# Patient Record
Sex: Female | Born: 1975
Health system: Southern US, Community
[De-identification: ages and names within clinical notes are randomized; demographics above are authoritative.]

---

## 2009-11-29 ENCOUNTER — Emergency Department: Payer: Self-pay | Admitting: Emergency Medicine

## 2009-12-03 ENCOUNTER — Emergency Department: Payer: Self-pay | Admitting: Emergency Medicine

## 2010-03-06 ENCOUNTER — Emergency Department: Payer: Self-pay | Admitting: Emergency Medicine

## 2012-05-27 ENCOUNTER — Emergency Department: Payer: Self-pay | Admitting: Emergency Medicine

## 2012-05-27 LAB — CBC
HCT: 40.9 % (ref 35.0–47.0)
MCHC: 34 g/dL (ref 32.0–36.0)
RBC: 4.84 10*6/uL (ref 3.80–5.20)
RDW: 14.2 % (ref 11.5–14.5)
WBC: 8.5 10*3/uL (ref 3.6–11.0)

## 2012-05-27 LAB — COMPREHENSIVE METABOLIC PANEL
Albumin: 3.8 g/dL (ref 3.4–5.0)
Alkaline Phosphatase: 87 U/L (ref 50–136)
Anion Gap: 8 (ref 7–16)
BUN: 14 mg/dL (ref 7–18)
Bilirubin,Total: 1.7 mg/dL — ABNORMAL HIGH (ref 0.2–1.0)
Chloride: 106 mmol/L (ref 98–107)
Co2: 25 mmol/L (ref 21–32)
Creatinine: 0.84 mg/dL (ref 0.60–1.30)
EGFR (African American): >60
Glucose: 109 mg/dL — ABNORMAL HIGH (ref 65–99)
Osmolality: 279 (ref 275–301)
Potassium: 3.4 mmol/L — ABNORMAL LOW (ref 3.5–5.1)
SGOT(AST): 24 U/L (ref 15–37)

## 2012-05-27 LAB — URINALYSIS, COMPLETE
Bacteria: NONE SEEN
Bilirubin,UR: NEGATIVE
Glucose,UR: NEGATIVE mg/dL (ref 0–75)
Ketone: NEGATIVE
Nitrite: NEGATIVE
Protein: NEGATIVE
RBC,UR: <1 /HPF (ref 0–5)
Specific Gravity: 1.03 (ref 1.003–1.030)
Squamous Epithelial: 1
WBC UR: 2 /HPF (ref 0–5)

## 2015-05-02 ENCOUNTER — Emergency Department: Payer: Managed Care, Other (non HMO)

## 2015-05-02 ENCOUNTER — Encounter: Payer: Self-pay | Admitting: Emergency Medicine

## 2015-05-02 ENCOUNTER — Emergency Department
Admission: EM | Admit: 2015-05-02 | Discharge: 2015-05-02 | Disposition: A | Discharge status: Home / Self Care | Payer: Managed Care, Other (non HMO) | Attending: Emergency Medicine | Admitting: Emergency Medicine

## 2015-05-02 DIAGNOSIS — Y9389 Activity, other specified: Secondary | ICD-10-CM | POA: Diagnosis not present

## 2015-05-02 DIAGNOSIS — S59912A Unspecified injury of left forearm, initial encounter: Secondary | ICD-10-CM | POA: Diagnosis not present

## 2015-05-02 DIAGNOSIS — Y9289 Other specified places as the place of occurrence of the external cause: Secondary | ICD-10-CM | POA: Insufficient documentation

## 2015-05-02 DIAGNOSIS — Y998 Other external cause status: Secondary | ICD-10-CM | POA: Diagnosis not present

## 2015-05-02 DIAGNOSIS — S63502A Unspecified sprain of left wrist, initial encounter: Secondary | ICD-10-CM | POA: Insufficient documentation

## 2015-05-02 DIAGNOSIS — W108XXA Fall (on) (from) other stairs and steps, initial encounter: Secondary | ICD-10-CM | POA: Insufficient documentation

## 2015-05-02 DIAGNOSIS — S0083XA Contusion of other part of head, initial encounter: Secondary | ICD-10-CM | POA: Diagnosis not present

## 2015-05-02 DIAGNOSIS — Z87891 Personal history of nicotine dependence: Secondary | ICD-10-CM | POA: Diagnosis not present

## 2015-05-02 DIAGNOSIS — S6992XA Unspecified injury of left wrist, hand and finger(s), initial encounter: Secondary | ICD-10-CM | POA: Diagnosis present

## 2015-05-02 DIAGNOSIS — S0990XA Unspecified injury of head, initial encounter: Secondary | ICD-10-CM | POA: Insufficient documentation

## 2015-05-02 DIAGNOSIS — W01198A Fall on same level from slipping, tripping and stumbling with subsequent striking against other object, initial encounter: Secondary | ICD-10-CM | POA: Insufficient documentation

## 2015-05-02 MED ORDER — OXYCODONE-ACETAMINOPHEN 5-325 MG PO TABS
1.0000 | ORAL_TABLET | Freq: Once | ORAL | Status: AC
  Administered 2015-05-02: 1 via ORAL
  Filled 2015-05-02: qty 1

## 2015-05-02 MED ORDER — NAPROXEN 500 MG PO TABS
500.0000 mg | ORAL_TABLET | Freq: Two times a day (BID) | ORAL | Status: DC
Start: 2015-05-02 — End: 2015-07-14

## 2015-05-02 MED ORDER — OXYCODONE-ACETAMINOPHEN 5-325 MG PO TABS: 1.0000 | ORAL_TABLET | Freq: Four times a day (QID) | ORAL | Status: DC | PRN

## 2015-05-02 NOTE — Discharge Instructions (Signed)
Facial or Scalp Contusion A facial or scalp contusion is a deep bruise on the face or head. Injuries to the face and head generally cause a lot of swelling, especially around the eyes. Contusions are the result of an injury that caused bleeding under the skin. The contusion may turn blue, purple, or yellow. Minor injuries will give you a painless contusion, but more severe contusions may stay painful and swollen for a few weeks.  CAUSES  A facial or scalp contusion is caused by a blunt injury or trauma to the face or head area.  SIGNS AND SYMPTOMS   Swelling of the injured area.   Discoloration of the injured area.   Tenderness, soreness, or pain in the injured area.  DIAGNOSIS  The diagnosis can be made by taking a medical history and doing a physical exam. An X-ray exam, CT scan, or MRI may be needed to determine if there are any associated injuries, such as broken bones (fractures). TREATMENT  Often, the best treatment for a facial or scalp contusion is applying cold compresses to the injured area. Over-the-counter medicines may also be recommended for pain control.  HOME CARE INSTRUCTIONS   Only take over-the-counter or prescription medicines as directed by your health care provider.   Apply ice to the injured area.   Put ice in a plastic bag.   Place a towel between your skin and the bag.   Leave the ice on for 20 minutes, 2-3 times a day.  SEEK MEDICAL CARE IF:  You have bite problems.   You have pain with chewing.   You are concerned about facial defects. SEEK IMMEDIATE MEDICAL CARE IF:  You have severe pain or a headache that is not relieved by medicine.   You have unusual sleepiness, confusion, or personality changes.   You throw up (vomit).   You have a persistent nosebleed.   You have double vision or blurred vision.   You have fluid drainage from your nose or ear.   You have difficulty walking or using your arms or legs.  MAKE SURE YOU:    Understand these instructions.  Will watch your condition.  Will get help right away if you are not doing well or get worse.   This information is not intended to replace advice given to you by your health care provider. Make sure you discuss any questions you have with your health care provider.   Document Released: 04/28/2004 Document Revised: 04/11/2014 Document Reviewed: 11/01/2012 Elsevier Interactive Patient Education 2016 [employer]  Wrist Sprain A wrist sprain is a stretch or tear in the strong, fibrous tissues (ligaments) that connect your wrist bones. The ligaments of your wrist may be easily sprained. There are three types of wrist sprains.  Grade 1. The ligament is not stretched or torn, but the sprain causes pain.  Grade 2. The ligament is stretched or partially torn. You may be able to move your wrist, but not very much.  Grade 3. The ligament or muscle completely tears. You may find it difficult or extremely painful to move your wrist even a little. CAUSES Often, wrist sprains are a result of a fall or an injury. The force of the impact causes the fibers of your ligament to stretch too much or tear. Common causes of wrist sprains include:  Overextending your wrist while catching a ball with your hands.  Repetitive or strenuous extension or bending of your wrist.  Landing on your hand during a fall. RISK FACTORS  Having previous wrist injuries.  Playing contact sports, such as boxing or wrestling.  Participating in activities in which falling is common.  Having poor wrist strength and flexibility. SIGNS AND SYMPTOMS  Wrist pain.  Wrist tenderness.  Inflammation or bruising of the wrist area.  Hearing a "pop" or feeling a tear at the time of the injury.  Decreased wrist movement due to pain, stiffness, or weakness. DIAGNOSIS Your health care provider will examine your wrist. In some cases, an X-ray will be taken to make sure you did not break any  bones. If your health care provider thinks that you tore a ligament, he or she may order an MRI of your wrist. TREATMENT Treatment involves resting and icing your wrist. You may also need to take pain medicines to help lessen pain and inflammation. Your health care provider may recommend keeping your wrist still (immobilized) with a splint to help your sprain heal. When the splint is no longer necessary, you may need to perform strengthening and stretching exercises. These exercises help you to regain strength and full range of motion in your wrist. Surgery is not usually needed for wrist sprains unless the ligament completely tears. HOME CARE INSTRUCTIONS  Rest your wrist. Do not do things that cause pain.  Wear your wrist splint as directed by your health care provider.  Take medicines only as directed by your health care provider.  To ease pain and swelling, apply ice to the injured area.  Put ice in a plastic bag.  Place a towel between your skin and the bag.  Leave the ice on for 20 minutes, 2-3 times a day. SEEK MEDICAL CARE IF:  Your pain, discomfort, or swelling gets worse even with treatment.  You feel sudden numbness in your hand.   This information is not intended to replace advice given to you by your health care provider. Make sure you discuss any questions you have with your health care provider.   Document Released: 11/22/2013 Document Reviewed: 11/22/2013 Elsevier Interactive Patient Education [employer]

## 2015-05-02 NOTE — ED Notes (Signed)
Fell down 4 stairs 20 minutes ago, pain L arm, R cheek

## 2015-05-02 NOTE — ED Provider Notes (Signed)
[facility] Emergency Department Provider Note  ____________________________________________  Time seen: Approximately 1:05 PM  I have reviewed the triage vital signs and the nursing notes.   HISTORY  Chief Complaint Arm Pain    HPI Brittney Rose is a 40 y.o. female who presents emergency department status post falling down a flight of stairs. She states that she tripped, falling striking her face and trying to catch herself the left arm. She endorses swelling to right cheek and bruising to left forearm. Patient states that she has a headache at this time. She denies any visual acuity changes, neck pain, chest pain, abdominal pain, nausea or vomiting, numbness or tingling. She endorses sharp pain to her cheek that is severe. She also endorses a throbbing/sharp sensation to left forearm with bruising. Patient denies taking any medications prior to arrival. Patient is not on blood thinners at this time.   History reviewed. No pertinent past medical history.  There are no active problems to display for this patient.   History reviewed. No pertinent past surgical history.  Current Outpatient Rx  Name  Route  Sig  Dispense  Refill  . naproxen (NAPROSYN) 500 MG tablet   Oral   Take 1 tablet (500 mg total) by mouth 2 (two) times daily with a meal.   60 tablet   0   . oxyCODONE-acetaminophen (ROXICET) 5-325 MG tablet   Oral   Take 1 tablet by mouth every 6 (six) hours as needed for severe pain.   20 tablet   0     Allergies Aspirin and Codeine  No family history on file.  Social History Social History  Substance Use Topics  . Smoking status: Former Games developer  . Smokeless tobacco: None  . Alcohol Use: No     Review of Systems  Constitutional: No fever/chills Eyes: No visual changes. No discharge ENT: No sore throat. Cardiovascular: no chest pain. Respiratory: no cough. No SOB. Gastrointestinal: No abdominal pain.  No nausea, no vomiting.  No  diarrhea.  No constipation. Genitourinary: Negative for dysuria. No hematuria Musculoskeletal: Negative for back pain. Endorses right-sided facial pain. Endorses left forearm pain Skin: Negative for rash. Neurological: Negative for headaches, focal weakness or numbness. 10-point ROS otherwise negative.  ____________________________________________   PHYSICAL EXAM:  VITAL SIGNS: ED Triage Vitals  Enc Vitals Group     BP 05/02/15 1243 128/94 mmHg     Pulse Rate 05/02/15 1243 95     Resp 05/02/15 1243 18     Temp 05/02/15 1243 98.3 F (36.8 C)     Temp Source 05/02/15 1243 Oral     SpO2 05/02/15 1243 100 %     Weight 05/02/15 1243 225 lb (102.059 kg)     Height 05/02/15 1243  (1.651 m)     Head Cir --      Peak Flow --      Pain Score 05/02/15 1246 10     Pain Loc --      Pain Edu? --      Excl. in GC? --      Constitutional: Alert and oriented. Well appearing and in no acute distress. Eyes: Conjunctivae are normal. PERRL. EOMI. Head: Edema and ecchymosis noted to right cheek. No visible abnormality. No other contusions, abrasions, lacerations. No palpable abnormality. No crepitus to palpation. There is tenderness to palpation over the right zygomatic arch. Good extraocular eye movement. ENT:      Ears: No postauricular Battle sign. No serosanguineous fluid drainage.  Nose: No congestion/rhinnorhea. No epistaxis or serosanguineous fluid drainage.      Mouth/Throat: Mucous membranes are moist.  Neck: No stridor.  No cervical spine tenderness to palpation. Hematological/Lymphatic/Immunilogical: No cervical lymphadenopathy. Cardiovascular: Normal rate, regular rhythm. Normal S1 and S2.  Good peripheral circulation. Respiratory: Normal respiratory effort without tachypnea or retractions. Lungs CTAB. Gastrointestinal: Soft and nontender. No distention. No CVA tenderness. Musculoskeletal: No lower extremity tenderness nor edema.  No joint effusions. Neurologic:  Normal  speech and language. No gross focal neurologic deficits are appreciated.  Skin:  Skin is warm, dry and intact. No rash noted. Psychiatric: Mood and affect are normal. Speech and behavior are normal. Patient exhibits appropriate insight and judgement.   ____________________________________________   LABS (all labs ordered are listed, but only abnormal results are displayed)  Labs Reviewed  POC URINE PREG, ED   ____________________________________________  EKG   ____________________________________________  RADIOLOGY Festus Barren Mohmed Farver, personally viewed and evaluated these images (plain radiographs) as part of my medical decision making, as well as reviewing the written report by the radiologist.  Dg Forearm Left  05/02/2015  CLINICAL DATA:  Post fall down stairs today now with confusion involving the anterior aspect of the forearm. EXAM: LEFT FOREARM - 2 VIEW COMPARISON:  None. FINDINGS: Soft tissue swelling about the anterior aspect of the distal forearm. This finding is without associated displaced fracture or radiopaque foreign body. Limited visualization of the adjacent wrist and elbow is normal given obliquity and large field of view. No definite elbow joint effusion. IMPRESSION: Soft tissue swelling about the anterior aspect of the forearm without associated fracture or radiopaque foreign body. Electronically Signed   By: Simonne Come M.D.   On: 05/02/2015 13:30   Ct Head Wo Contrast  05/02/2015  CLINICAL DATA:  Fall downstairs with facial injury and headaches, initial encounter EXAM: CT HEAD WITHOUT CONTRAST CT MAXILLOFACIAL WITHOUT CONTRAST TECHNIQUE: Multidetector CT imaging of the head and maxillofacial structures were performed using the standard protocol without intravenous contrast. Multiplanar CT image reconstructions of the maxillofacial structures were also generated. COMPARISON:  None. FINDINGS: CT HEAD FINDINGS The bony calvarium is intact. No gross soft tissue  abnormality is noted. Ventricles are normal size configuration. There is some linear increased attenuation identified in the left parietal lobe in the deep white matter (image number 18 of series 2) which may represent some mild contusion. No other focal area of hemorrhage is seen. No acute infarct or space-occupying mass lesion is noted. CT MAXILLOFACIAL FINDINGS The bony structures are within normal limits. Mild soft tissue swelling is noted in the right infra orbital region laterally. The orbits and their contents are within normal limits. No blowout fracture is seen. No other significant soft tissue abnormality is noted. IMPRESSION: CT of the head: Changes somewhat suggestive of parenchymal contusion on the left. CT of the maxillofacial bones: Right facial swelling without evidence of bony injury. Electronically Signed   By: Alcide Clever M.D.   On: 05/02/2015 13:40   Ct Maxillofacial Wo Cm  05/02/2015  CLINICAL DATA:  Fall downstairs with facial injury and headaches, initial encounter EXAM: CT HEAD WITHOUT CONTRAST CT MAXILLOFACIAL WITHOUT CONTRAST TECHNIQUE: Multidetector CT imaging of the head and maxillofacial structures were performed using the standard protocol without intravenous contrast. Multiplanar CT image reconstructions of the maxillofacial structures were also generated. COMPARISON:  None. FINDINGS: CT HEAD FINDINGS The bony calvarium is intact. No gross soft tissue abnormality is noted. Ventricles are normal size configuration. There is some linear  increased attenuation identified in the left parietal lobe in the deep white matter (image number 18 of series 2) which may represent some mild contusion. No other focal area of hemorrhage is seen. No acute infarct or space-occupying mass lesion is noted. CT MAXILLOFACIAL FINDINGS The bony structures are within normal limits. Mild soft tissue swelling is noted in the right infra orbital region laterally. The orbits and their contents are within normal  limits. No blowout fracture is seen. No other significant soft tissue abnormality is noted. IMPRESSION: CT of the head: Changes somewhat suggestive of parenchymal contusion on the left. CT of the maxillofacial bones: Right facial swelling without evidence of bony injury. Electronically Signed   By: Alcide Clever M.D.   On: 05/02/2015 13:40    ____________________________________________    PROCEDURES  Procedure(s) performed:   SPLINT APPLICATION Date/Time: 2:15 PM Authorized by: Racheal Patches Consent: Verbal consent obtained. Risks and benefits: risks, benefits and alternatives were discussed Consent given by: patient Splint applied by: orthopedic technician Location details: Left wrist  Splint type: wrist cockup Supplies used: Velcro wrist brace  Post-procedure: The splinted body part was neurovascularly unchanged following the procedure. Patient tolerance: Patient tolerated the procedure well with no immediate complications.       Medications  oxyCODONE-acetaminophen (PERCOCET/ROXICET) 5-325 MG per tablet 1 tablet (1 tablet Oral Given 05/02/15 1344)     ____________________________________________   INITIAL IMPRESSION / ASSESSMENT AND PLAN / ED COURSE  Pertinent labs & imaging results that were available during my care of the patient were reviewed by me and considered in my medical decision making (see chart for details).  Patient's diagnosis is consistent with facial contusion and left wrist strain. Patient will be discharged home with prescriptions for anti-inflammatories and pain medication. Patient is given a Velcro wrist brace here in the emergency department for additional symptom control. Patient is to follow up with orthopedics if symptoms persist past this treatment course. Patient is given ED precautions to return to the ED for any worsening or new symptoms.     ____________________________________________  FINAL CLINICAL IMPRESSION(S) / ED  DIAGNOSES  Final diagnoses:  Left wrist sprain, initial encounter  Facial contusion, initial encounter      NEW MEDICATIONS STARTED DURING THIS VISIT:  New Prescriptions   NAPROXEN (NAPROSYN) 500 MG TABLET    Take 1 tablet (500 mg total) by mouth 2 (two) times daily with a meal.   OXYCODONE-ACETAMINOPHEN (ROXICET) 5-325 MG TABLET    Take 1 tablet by mouth every 6 (six) hours as needed for severe pain.        Delorise Royals Jdyn Parkerson, PA-C 05/02/15 1416  Minna Antis, MD 05/02/15 [phone]

## 2015-07-14 ENCOUNTER — Emergency Department
Admission: EM | Admit: 2015-07-14 | Discharge: 2015-07-14 | Disposition: A | Discharge status: Home / Self Care | Payer: Managed Care, Other (non HMO) | Attending: Emergency Medicine | Admitting: Emergency Medicine

## 2015-07-14 ENCOUNTER — Encounter: Payer: Self-pay | Admitting: Medical Oncology

## 2015-07-14 DIAGNOSIS — M545 Low back pain: Secondary | ICD-10-CM | POA: Diagnosis present

## 2015-07-14 DIAGNOSIS — Z87891 Personal history of nicotine dependence: Secondary | ICD-10-CM | POA: Diagnosis not present

## 2015-07-14 DIAGNOSIS — N390 Urinary tract infection, site not specified: Secondary | ICD-10-CM | POA: Diagnosis not present

## 2015-07-14 DIAGNOSIS — M5441 Lumbago with sciatica, right side: Secondary | ICD-10-CM | POA: Insufficient documentation

## 2015-07-14 LAB — URINALYSIS COMPLETE WITH MICROSCOPIC (ARMC ONLY)
Bacteria, UA: NONE SEEN
Bilirubin Urine: NEGATIVE
GLUCOSE, UA: NEGATIVE mg/dL
HGB URINE DIPSTICK: NEGATIVE
Ketones, ur: NEGATIVE mg/dL
LEUKOCYTES UA: NEGATIVE
NITRITE: POSITIVE — AB
PROTEIN: 30 mg/dL — AB
SPECIFIC GRAVITY, URINE: 1.03 (ref 1.005–1.030)
pH: 5 (ref 5.0–8.0)

## 2015-07-14 MED ORDER — FLUCONAZOLE 150 MG PO TABS: 150.0000 mg | ORAL_TABLET | Freq: Every day | ORAL | Status: DC

## 2015-07-14 MED ORDER — KETOROLAC TROMETHAMINE 60 MG/2ML IM SOLN: 60.0000 mg | Freq: Once | INTRAMUSCULAR | Status: DC

## 2015-07-14 MED ORDER — SULFAMETHOXAZOLE-TRIMETHOPRIM 800-160 MG PO TABS: 1.0000 | ORAL_TABLET | Freq: Two times a day (BID) | ORAL | Status: DC

## 2015-07-14 MED ORDER — BACLOFEN 10 MG PO TABS: 10.0000 mg | ORAL_TABLET | Freq: Three times a day (TID) | ORAL | Status: DC

## 2015-07-14 NOTE — ED Notes (Signed)
See triage note. States she developed some pain to right lower back this am  States pain runs into right leg  Radiates from hip into lateral aspect of leg

## 2015-07-14 NOTE — ED Notes (Signed)
Pt began having rt lower back pain last night, pain worsened into rt hip and down leg this am.

## 2015-07-14 NOTE — Discharge Instructions (Signed)
Radicular Pain Radicular pain in either the arm or leg is usually from a bulging or herniated disk in the spine. A piece of the herniated disk may press against the nerves as the nerves exit the spine. This causes pain which is felt at the tips of the nerves down the arm or leg. Other causes of radicular pain may include:  Fractures.  Heart disease.  Cancer.  An abnormal and usually degenerative state of the nervous system or nerves (neuropathy). Diagnosis may require CT or MRI scanning to determine the primary cause.  Nerves that start at the neck (nerve roots) may cause radicular pain in the outer shoulder and arm. It can spread down to the thumb and fingers. The symptoms vary depending on which nerve root has been affected. In most cases radicular pain improves with conservative treatment. Neck problems may require physical therapy, a neck collar, or cervical traction. Treatment may take many weeks, and surgery may be considered if the symptoms do not improve.  Conservative treatment is also recommended for sciatica. Sciatica causes pain to radiate from the lower back or buttock area down the leg into the foot. Often there is a history of back problems. Most patients with sciatica are better after 2 to 4 weeks of rest and other supportive care. Short term bed rest can reduce the disk pressure considerably. Sitting, however, is not a good position since this increases the pressure on the disk. You should avoid bending, lifting, and all other activities which make the problem worse. Traction can be used in severe cases. Surgery is usually reserved for patients who do not improve within the first months of treatment. Only take over-the-counter or prescription medicines for pain, discomfort, or fever as directed by your caregiver. Narcotics and muscle relaxants may help by relieving more severe pain and spasm and by providing mild sedation. Cold or massage can give significant relief. Spinal manipulation  is not recommended. It can increase the degree of disc protrusion. Epidural steroid injections are often effective treatment for radicular pain. These injections deliver medicine to the spinal nerve in the space between the protective covering of the spinal cord and back bones (vertebrae). Your caregiver can give you more information about steroid injections. These injections are most effective when given within two weeks of the onset of pain.  You should see your caregiver for follow up care as recommended. A program for neck and back injury rehabilitation with stretching and strengthening exercises is an important part of management.  SEEK IMMEDIATE MEDICAL CARE IF:  You develop increased pain, weakness, or numbness in your arm or leg.  You develop difficulty with bladder or bowel control.  You develop abdominal pain.   This information is not intended to replace advice given to you by your health care provider. Make sure you discuss any questions you have with your health care provider.   Document Released: 04/28/2004 Document Revised: 04/11/2014 Document Reviewed: 10/15/2014 Elsevier Interactive Patient Education 2016 [employer]  Urinary Tract Infection Urinary tract infections (UTIs) can develop anywhere along your urinary tract. Your urinary tract is your body's drainage system for removing wastes and extra water. Your urinary tract includes two kidneys, two ureters, a bladder, and a urethra. Your kidneys are a pair of bean-shaped organs. Each kidney is about the size of your fist. They are located below your ribs, one on each side of your spine. CAUSES Infections are caused by microbes, which are microscopic organisms, including fungi, viruses, and bacteria. These organisms are  so small that they can only be seen through a microscope. Bacteria are the microbes that most commonly cause UTIs. SYMPTOMS  Symptoms of UTIs may vary by age and gender of the patient and by the location of the  infection. Symptoms in young women typically include a frequent and intense urge to urinate and a painful, burning feeling in the bladder or urethra during urination. Older women and men are more likely to be tired, shaky, and weak and have muscle aches and abdominal pain. A fever may mean the infection is in your kidneys. Other symptoms of a kidney infection include pain in your back or sides below the ribs, nausea, and vomiting. DIAGNOSIS To diagnose a UTI, your caregiver will ask you about your symptoms. Your caregiver will also ask you to provide a urine sample. The urine sample will be tested for bacteria and white blood cells. White blood cells are made by your body to help fight infection. TREATMENT  Typically, UTIs can be treated with medication. Because most UTIs are caused by a bacterial infection, they usually can be treated with the use of antibiotics. The choice of antibiotic and length of treatment depend on your symptoms and the type of bacteria causing your infection. HOME CARE INSTRUCTIONS  If you were prescribed antibiotics, take them exactly as your caregiver instructs you. Finish the medication even if you feel better after you have only taken some of the medication.  Drink enough water and fluids to keep your urine clear or pale yellow.  Avoid caffeine, tea, and carbonated beverages. They tend to irritate your bladder.  Empty your bladder often. Avoid holding urine for long periods of time.  Empty your bladder before and after sexual intercourse.  After a bowel movement, women should cleanse from front to back. Use each tissue only once. SEEK MEDICAL CARE IF:   You have back pain.  You develop a fever.  Your symptoms do not begin to resolve within 3 days. SEEK IMMEDIATE MEDICAL CARE IF:   You have severe back pain or lower abdominal pain.  You develop chills.  You have nausea or vomiting.  You have continued burning or discomfort with urination. MAKE SURE YOU:     Understand these instructions.  Will watch your condition.  Will get help right away if you are not doing well or get worse.   This information is not intended to replace advice given to you by your health care provider. Make sure you discuss any questions you have with your health care provider.   Document Released: 12/29/2004 Document Revised: 12/10/2014 Document Reviewed: 04/29/2011 Elsevier Interactive Patient Education [employer]

## 2015-07-14 NOTE — ED Provider Notes (Signed)
[facility] Emergency Department Provider Note  ____________________________________________  Time seen: Approximately 10:20 AM  I have reviewed the triage vital signs and the nursing notes.   HISTORY  Chief Complaint Back Pain and Hip Pain    HPI Brittney Rose is a 40 y.o. female who reports having some low back pain last night worsened this morning. Patient states the pain radiates down to the right hip and down the leg. Doesn't think that is urinary tract infection however she has complained of some increased frequency with urination. Denies any recent trauma or injury. Pain is 8/10.   History reviewed. No pertinent past medical history.  There are no active problems to display for this patient.   History reviewed. No pertinent past surgical history.  Current Outpatient Rx  Name  Route  Sig  Dispense  Refill  . baclofen (LIORESAL) 10 MG tablet   Oral   Take 1 tablet (10 mg total) by mouth 3 (three) times daily.   30 tablet   0   . fluconazole (DIFLUCAN) 150 MG tablet   Oral   Take 1 tablet (150 mg total) by mouth daily.   1 tablet   0   . sulfamethoxazole-trimethoprim (BACTRIM DS,SEPTRA DS) 800-160 MG tablet   Oral   Take 1 tablet by mouth 2 (two) times daily.   20 tablet   0     Allergies Aspirin and Codeine  No family history on file.  Social History Social History  Substance Use Topics  . Smoking status: Former Games developer  . Smokeless tobacco: None  . Alcohol Use: No    Review of Systems Constitutional: No fever/chills Cardiovascular: Denies chest pain. Respiratory: Denies shortness of breath. Gastrointestinal: No abdominal pain.  No nausea, no vomiting.  No diarrhea.  No constipation. Genitourinary: Negative for dysuria. Positive for frequency. Musculoskeletal: Positive for right-sided lower back pain with radiation to the leg Skin: Negative for rash. Neurological: Negative for headaches, focal weakness or  numbness.  10-point ROS otherwise negative.  ____________________________________________   PHYSICAL EXAM:  VITAL SIGNS: ED Triage Vitals  Enc Vitals Group     BP 07/14/15 0927 133/89 mmHg     Pulse Rate 07/14/15 0927 87     Resp 07/14/15 0927 16     Temp 07/14/15 0927 98.5 F (36.9 C)     Temp Source 07/14/15 0927 Oral     SpO2 07/14/15 0927 97 %     Weight 07/14/15 0921 230 lb (104.327 kg)     Height 07/14/15 0921  (1.626 m)     Head Cir --      Peak Flow --      Pain Score 07/14/15 0921 8     Pain Loc --      Pain Edu? --      Excl. in GC? --     Constitutional: Alert and oriented. Well appearing and in no acute distress. Cardiovascular: Normal rate, regular rhythm. Grossly normal heart sounds.  Good peripheral circulation. Respiratory: Normal respiratory effort.  No retractions. Lungs CTAB. Gastrointestinal: Soft and nontender. No distention.  No CVA tenderness. Musculoskeletal: No lower extremity tenderness nor edema.  No joint effusions. Straight leg raise positive right side. Distally neurovascularly intact. Neurologic:  Normal speech and language. No gross focal neurologic deficits are appreciated. No gait instability. Skin:  Skin is warm, dry and intact. No rash noted. Psychiatric: Mood and affect are normal. Speech and behavior are normal.  ____________________________________________   LABS (all labs ordered  are listed, but only abnormal results are displayed)  Labs Reviewed  URINALYSIS COMPLETEWITH MICROSCOPIC ([facility] ONLY) - Abnormal; Notable for the following:    Color, Urine AMBER (*)    APPearance CLEAR (*)    Protein, ur 30 (*)    Nitrite POSITIVE (*)    Squamous Epithelial / LPF 0-5 (*)    All other components within normal limits     PROCEDURES  Procedure(s) performed: None  Critical Care performed: No  ____________________________________________   INITIAL IMPRESSION / ASSESSMENT AND PLAN / ED COURSE  Pertinent labs & imaging  results that were available during my care of the patient were reviewed by me and considered in my medical decision making (see chart for details).  Acute lumbar strain and possible sciatica down the right side. Acute urinary tract infection. Rx given for Bactrim DS twice a day, baclofen 10 mg 4 times a day. Patient will follow-up with her PCP or return to the ER with any worsening symptomology. She voices no other emergency medical complaints at this time.  ____________________________________________   FINAL CLINICAL IMPRESSION(S) / ED DIAGNOSES  Final diagnoses:  UTI (lower urinary tract infection)  Right-sided low back pain with right-sided sciatica     This chart was dictated using voice recognition software/Dragon. Despite best efforts to proofread, errors can occur which can change the meaning. Any change was purely unintentional.   Evangeline Dakinharles M Omarri Eich, PA-C 07/14/15 1656  Jeanmarie PlantJames A McShane, MD 07/15/15 [phone]

## 2015-10-05 ENCOUNTER — Encounter (HOSPITAL_COMMUNITY): Payer: Self-pay

## 2015-10-05 ENCOUNTER — Emergency Department (HOSPITAL_COMMUNITY)
Admission: EM | Admit: 2015-10-05 | Discharge: 2015-10-05 | Disposition: A | Discharge status: Home / Self Care | Payer: Managed Care, Other (non HMO) | Attending: Emergency Medicine | Admitting: Emergency Medicine

## 2015-10-05 DIAGNOSIS — Z87891 Personal history of nicotine dependence: Secondary | ICD-10-CM | POA: Insufficient documentation

## 2015-10-05 DIAGNOSIS — L509 Urticaria, unspecified: Secondary | ICD-10-CM | POA: Insufficient documentation

## 2015-10-05 DIAGNOSIS — L299 Pruritus, unspecified: Secondary | ICD-10-CM | POA: Diagnosis present

## 2015-10-05 LAB — BASIC METABOLIC PANEL
ANION GAP: 6 (ref 5–15)
BUN: 10 mg/dL (ref 6–20)
CHLORIDE: 108 mmol/L (ref 101–111)
CO2: 22 mmol/L (ref 22–32)
CREATININE: 0.79 mg/dL (ref 0.44–1.00)
Calcium: 8.7 mg/dL — ABNORMAL LOW (ref 8.9–10.3)
GFR calc non Af Amer: >60 mL/min (ref >60)
Glucose, Bld: 150 mg/dL — ABNORMAL HIGH (ref 65–99)
POTASSIUM: 3.5 mmol/L (ref 3.5–5.1)
SODIUM: 136 mmol/L (ref 135–145)

## 2015-10-05 LAB — CBC WITH DIFFERENTIAL/PLATELET
Basophils Absolute: 0 10*3/uL (ref 0.0–0.1)
Basophils Relative: 0 %
EOS ABS: 0.1 10*3/uL (ref 0.0–0.7)
EOS PCT: 2 %
HCT: 37.5 % (ref 36.0–46.0)
Hemoglobin: 12.2 g/dL (ref 12.0–15.0)
LYMPHS ABS: 1.1 10*3/uL (ref 0.7–4.0)
Lymphocytes Relative: 20 %
MCH: 28.3 pg (ref 26.0–34.0)
MCHC: 32.5 g/dL (ref 30.0–36.0)
MCV: 87 fL (ref 78.0–100.0)
Monocytes Absolute: 0.3 10*3/uL (ref 0.1–1.0)
Monocytes Relative: 6 %
Neutro Abs: 3.8 10*3/uL (ref 1.7–7.7)
Neutrophils Relative %: 72 %
PLATELETS: 218 10*3/uL (ref 150–400)
RBC: 4.31 MIL/uL (ref 3.87–5.11)
RDW: 12.9 % (ref 11.5–15.5)
WBC: 5.3 10*3/uL (ref 4.0–10.5)

## 2015-10-05 MED ORDER — FAMOTIDINE 20 MG PO TABS
20.0000 mg | ORAL_TABLET | Freq: Once | ORAL | Status: AC
  Administered 2015-10-05: 20 mg via ORAL
  Filled 2015-10-05: qty 1

## 2015-10-05 MED ORDER — HYDROXYZINE HCL 25 MG PO TABS: 25.0000 mg | ORAL_TABLET | Freq: Four times a day (QID) | ORAL | Status: AC | PRN

## 2015-10-05 MED ORDER — HYDROXYZINE HCL 25 MG PO TABS
25.0000 mg | ORAL_TABLET | Freq: Once | ORAL | Status: AC
  Administered 2015-10-05: 25 mg via ORAL
  Filled 2015-10-05: qty 1

## 2015-10-05 NOTE — ED Notes (Signed)
PT. REPORTS HAVING ITCHING FOR THE LAST 4 WEEKS.Marland Kitchen. THEY JUST MOVED HERE FROM [place] AND SINCE THE MOVE THESE SYMPTOMS HAVE BEGUN.  SHE HAS NOT HIVES OR RASH.  BUT WHEN SHE BEGINS TO SCRATCH THEN THE HIVES COME.  THESE SYMPTOMS ARE INTERMITTENT AND SHE ALSO HAS HEARTBURN WITH IT.  TODAY SHE DEVELOPED LOOSE STOOL AND DRY HEAVES.  SHE DENIES CHANGING ANY ADLS.

## 2015-10-05 NOTE — Discharge Instructions (Signed)
Use the prescription hydroxyzine as needed for itching. It will make you sleepy. You should also use famotidine (Pepcid) 20 mg twice a day as needed for itching.   Hives Hives are itchy, red, swollen areas of the skin. They can vary in size and location on your body. Hives can come and go for hours or several days (acute hives) or for several weeks (chronic hives). Hives do not spread from person to person (noncontagious). They may get worse with scratching, exercise, and emotional stress. CAUSES   Allergic reaction to food, additives, or drugs.  Infections, including the common cold.  Illness, such as vasculitis, lupus, or thyroid disease.  Exposure to sunlight, heat, or cold.  Exercise.  Stress.  Contact with chemicals. SYMPTOMS   Red or white swollen patches on the skin. The patches may change size, shape, and location quickly and repeatedly.  Itching.  Swelling of the hands, feet, and face. This may occur if hives develop deeper in the skin. DIAGNOSIS  Your caregiver can usually tell what is wrong by performing a physical exam. Skin or blood tests may also be done to determine the cause of your hives. In some cases, the cause cannot be determined. TREATMENT  Mild cases usually get better with medicines such as antihistamines. Severe cases may require an emergency epinephrine injection. If the cause of your hives is known, treatment includes avoiding that trigger.  HOME CARE INSTRUCTIONS   Avoid causes that trigger your hives.  Take antihistamines as directed by your caregiver to reduce the severity of your hives. Non-sedating or low-sedating antihistamines are usually recommended. Do not drive while taking an antihistamine.  Take any other medicines prescribed for itching as directed by your caregiver.  Wear loose-fitting clothing.  Keep all follow-up appointments as directed by your caregiver. SEEK MEDICAL CARE IF:   You have persistent or severe itching that is not  relieved with medicine.  You have painful or swollen joints. SEEK IMMEDIATE MEDICAL CARE IF:   You have a fever.  Your tongue or lips are swollen.  You have trouble breathing or swallowing.  You feel tightness in the throat or chest.  You have abdominal pain. These problems may be the first sign of a life-threatening allergic reaction. Call your local emergency services (911 in [place] MAKE SURE YOU:   Understand these instructions.  Will watch your condition.  Will get help right away if you are not doing well or get worse.   This information is not intended to replace advice given to you by your health care provider. Make sure you discuss any questions you have with your health care provider.   Document Released: 03/21/2005 Document Revised: 03/26/2013 Document Reviewed: 06/14/2011 Elsevier Interactive Patient Education [employer]2016 Elsevier Inc.

## 2015-10-05 NOTE — ED Provider Notes (Signed)
CSN: [id number]     Arrival date & time 10/05/15  [id number] History   First MD Initiated Contact with Patient 10/05/15 [phone]     Chief Complaint  Patient presents with  . Allergic Reaction     (Consider location/radiation/quality/duration/timing/severity/associated sxs/prior Treatment) HPI   Brittney Rose is a 40 y.o. female who presents for evaluation of intermittent itching for 3 weeks. [place]. She denies stress. She denies shortness of breath, weakness or dizziness. She went to urgent care 2 weeks ago for some pain, and was treated with a short course of prednisone, which helped temporarily. She is also taking Benadryl and Zyrtec. Her symptoms have persisted. She denies prior similar problem. There are no other known modifying factors.   History reviewed. No pertinent past medical history. History reviewed. No pertinent past surgical history. No family history on file. Social History  Substance Use Topics  . Smoking status: Former Games developermoker  . Smokeless tobacco: None  . Alcohol Use: No   OB History    No data available     Review of Systems  All other systems reviewed and are negative.     Allergies  Aspirin; Codeine; and Penicillins  Home Medications   Prior to Admission medications   Medication Sig Start Date End Date Taking? Authorizing Provider  cetirizine (ZYRTEC) 10 MG tablet Take 10 mg by mouth daily.   Yes Historical Provider, MD  ibuprofen (ADVIL,MOTRIN) 200 MG tablet Take 200 mg by mouth every 6 (six) hours as needed for mild pain.   Yes Historical Provider, MD  Multiple Vitamins-Minerals (MULTIVITAMIN WITH MINERALS) tablet Take 1 tablet by mouth daily.   Yes Historical Provider, MD  hydrOXYzine (ATARAX/VISTARIL) 25 MG tablet Take 1 tablet (25 mg total) by mouth every 6 (six) hours as needed for itching. 10/05/15   Mancel BaleElliott Rilyn Upshaw, MD   BP 133/88 mmHg  Pulse 89  Temp(Src) 98.2 F (36.8 C) (Oral)  Resp 21  SpO2 100%  LMP  10/04/2015 Physical Exam  Constitutional: She is oriented to person, place, and time. She appears well-developed and well-nourished. No distress.  HENT:  Head: Normocephalic and atraumatic.  Right Ear: External ear normal.  Left Ear: External ear normal.  No angioedema  Eyes: Conjunctivae and EOM are normal. Pupils are equal, round, and reactive to light.  Neck: Normal range of motion and phonation normal. Neck supple.  Cardiovascular: Normal rate, regular rhythm and normal heart sounds.   Pulmonary/Chest: Effort normal and breath sounds normal. No respiratory distress. She has no wheezes. She exhibits no bony tenderness.  Abdominal: Soft. There is no tenderness.  Musculoskeletal: Normal range of motion.  Neurological: She is alert and oriented to person, place, and time. No cranial nerve deficit or sensory deficit. She exhibits normal muscle tone. Coordination normal.  Skin: Skin is warm, dry and intact.  Few scattered hives  Psychiatric: She has a normal mood and affect. Her behavior is normal. Judgment and thought content normal.  Nursing note and vitals reviewed.   ED Course  Procedures (including critical care time)  Medications  famotidine (PEPCID) tablet 20 mg (20 mg Oral Given 10/05/15 0856)  hydrOXYzine (ATARAX/VISTARIL) tablet 25 mg (25 mg Oral Given 10/05/15 0856)    Patient Vitals for the past 24 hrs:  BP Temp Temp src Pulse Resp SpO2  10/05/15 0749 133/88 mmHg 98.2 F (36.8 C) Oral 89 21 100 %    8:58 AM Reevaluation with update and discussion. After initial assessment and treatment, an updated  evaluation reveals No change in clinical status. Findings discussed with the patient and all questions were answered. Malcom Selmer L   Labs Review Labs Reviewed  BASIC METABOLIC PANEL - Abnormal; Notable for the following:    Glucose, Bld 150 (*)    Calcium 8.7 (*)    All other components within normal limits  CBC WITH DIFFERENTIAL/PLATELET    Imaging Review No results  found. I have personally reviewed and evaluated these images and lab results as part of my medical decision-making.   EKG Interpretation None      MDM   Final diagnoses:  Hives    Urticaria, cause unclear. Differential diagnosis includes stress-related dermatitis. No evidence for internal allergic reaction or significant angioedema.   Nursing Notes Reviewed/ Care Coordinated Applicable Imaging Reviewed Interpretation of Laboratory Data incorporated into ED treatment  The patient appears reasonably screened and/or stabilized for discharge and I doubt any other medical condition or other [facility] requiring further screening, evaluation, or treatment in the ED at this time prior to discharge.  Plan: Home Medications- hydroxyzine, famotidine; Home Treatments- rest; return here if the recommended treatment, does not improve the symptoms; Recommended follow up- PCP follow-up for ongoing management   Mancel BaleElliott Lavella Myren, MD 10/05/15 0900

## 2018-01-04 ENCOUNTER — Other Ambulatory Visit: Payer: Self-pay | Admitting: Adult Health

## 2018-01-04 DIAGNOSIS — Z1231 Encounter for screening mammogram for malignant neoplasm of breast: Secondary | ICD-10-CM

## 2018-01-17 ENCOUNTER — Encounter (HOSPITAL_COMMUNITY): Payer: Self-pay

## 2018-01-17 ENCOUNTER — Ambulatory Visit
Admission: RE | Admit: 2018-01-17 | Discharge: 2018-01-17 | Disposition: A | Discharge status: Home / Self Care | Payer: 59 | Source: Ambulatory Visit | Attending: Adult Health | Admitting: Adult Health

## 2018-01-17 DIAGNOSIS — Z1231 Encounter for screening mammogram for malignant neoplasm of breast: Secondary | ICD-10-CM

## 2019-06-20 ENCOUNTER — Ambulatory Visit: Payer: Self-pay | Attending: Internal Medicine

## 2019-06-20 DIAGNOSIS — Z23 Encounter for immunization: Secondary | ICD-10-CM

## 2019-06-20 NOTE — Progress Notes (Signed)
   Covid-19 Vaccination Clinic  Name:  Regina Ganci    MRN: [id number] DOB: 10/09/1975  06/20/2019  Ms. Hurd was observed post Covid-19 immunization for 15 minutes without incident. She was provided with Vaccine Information Sheet and instruction to access the V-Safe system.   Ms. Mccalister was instructed to call 911 with any severe reactions post vaccine: Marland Kitchen Difficulty breathing  . Swelling of face and throat  . A fast heartbeat  . A bad rash all over body  . Dizziness and weakness   Immunizations Administered    Name Date Dose VIS Date Route   Pfizer COVID-19 Vaccine 06/20/2019 12:51 PM 0.3 mL 03/15/2019 Intramuscular   Manufacturer: [employer], [employer]   Lot: [id number]   NDC: [address]-0

## 2019-07-15 ENCOUNTER — Ambulatory Visit: Payer: Self-pay

## 2019-07-27 ENCOUNTER — Ambulatory Visit: Payer: Self-pay

## 2020-01-21 ENCOUNTER — Encounter: Payer: Self-pay | Admitting: Emergency Medicine

## 2020-01-21 ENCOUNTER — Ambulatory Visit
Admission: EM | Admit: 2020-01-21 | Discharge: 2020-01-21 | Disposition: A | Discharge status: Home / Self Care | Payer: Commercial Managed Care - PPO | Attending: Emergency Medicine | Admitting: Emergency Medicine

## 2020-01-21 ENCOUNTER — Other Ambulatory Visit: Payer: Self-pay

## 2020-01-21 DIAGNOSIS — M546 Pain in thoracic spine: Secondary | ICD-10-CM

## 2020-01-21 MED ORDER — CYCLOBENZAPRINE HCL 5 MG PO TABS: 5.0000 mg | ORAL_TABLET | Freq: Two times a day (BID) | ORAL | 0 refills | Status: AC | PRN

## 2020-01-21 NOTE — Discharge Instructions (Signed)

## 2020-01-21 NOTE — ED Provider Notes (Signed)
[facility]    CSN: [id number] Arrival date & time: 01/21/20  0817      History   Chief Complaint Chief Complaint  Patient presents with  . Shoulder Pain    HPI Brittney Rose is a 44 y.o. female  Presenting for left scapular pain.  States is worse with movement and certain positions.  No chest pain, difficulty breathing, injury.  Denies neck pain, left arm numbness or weakness.  Tried ibuprofen without relief, Aleve with some relief.  History reviewed. No pertinent past medical history.  There are no problems to display for this patient.   History reviewed. No pertinent surgical history.  OB History   No obstetric history on file.      Home Medications    Prior to Admission medications   Medication Sig Start Date End Date Taking? Authorizing Provider  cetirizine (ZYRTEC) 10 MG tablet Take 10 mg by mouth daily.    [provider]  cyclobenzaprine (FLEXERIL) 5 MG tablet Take 1 tablet (5 mg total) by mouth 2 (two) times daily as needed for up to 7 days for muscle spasms. 01/21/20 01/28/20  Hall-Potvin, [place], PA-C  hydrOXYzine (ATARAX/VISTARIL) 25 MG tablet Take 1 tablet (25 mg total) by mouth every 6 (six) hours as needed for itching. 10/05/15   Mancel Bale, MD  ibuprofen (ADVIL,MOTRIN) 200 MG tablet Take 200 mg by mouth every 6 (six) hours as needed for mild pain.    [provider]  Multiple Vitamins-Minerals (MULTIVITAMIN WITH MINERALS) tablet Take 1 tablet by mouth daily.    [provider]    Family History Family History  Problem Relation Age of Onset  . Healthy Mother   . Healthy Father   . Breast cancer Neg Hx     Social History Social History   Tobacco Use  . Smoking status: Former Smoker  Substance Use Topics  . Alcohol use: No  . Drug use: No     Allergies   Aspirin, Codeine, and Penicillins   Review of Systems As per HPI   Physical Exam Triage Vital Signs ED Triage Vitals  Enc Vitals  Group     BP      Pulse      Resp      Temp      Temp src      SpO2      Weight      Height      Head Circumference      Peak Flow      Pain Score      Pain Loc      Pain Edu?      Excl. in GC?    No data found.  Updated Vital Signs BP 130/86 (BP Location: Right Arm)   Pulse (!) 108   Temp 98.2 F (36.8 C) (Oral)   Resp 20   SpO2 99%   Visual Acuity Right Eye Distance:   Left Eye Distance:   Bilateral Distance:    Right Eye Near:   Left Eye Near:    Bilateral Near:     Physical Exam Constitutional:      General: She is not in acute distress. HENT:     Head: Normocephalic and atraumatic.  Eyes:     General: No scleral icterus.    Pupils: Pupils are equal, round, and reactive to light.  Cardiovascular:     Rate and Rhythm: Normal rate.  Pulmonary:     Effort: Pulmonary effort is normal.  Musculoskeletal:     Right shoulder: Normal.     Left shoulder: Tenderness present. No swelling, deformity, bony tenderness or crepitus. Normal range of motion. Normal strength.       Arms:  Skin:    Coloration: Skin is not jaundiced or pale.  Neurological:     Mental Status: She is alert and oriented to person, place, and time.      UC Treatments / Results  Labs (all labs ordered are listed, but only abnormal results are displayed) Labs Reviewed - No data to display  EKG   Radiology No results found.  Procedures Procedures (including critical care time)  Medications Ordered in UC Medications - No data to display  Initial Impression / Assessment and Plan / UC Course  I have reviewed the triage vital signs and the nursing notes.  Pertinent labs & imaging results that were available during my care of the patient were reviewed by me and considered in my medical decision making (see chart for details).     We will treat for MSK pain as below.  Return precautions discussed, pt verbalized understanding and is agreeable to plan. Final Clinical Impressions(s)  / UC Diagnoses   Final diagnoses:  Acute left-sided thoracic back pain     Discharge Instructions     Heat therapy (hot compress, warm wash rag, hot showers, etc.) can help relax muscles and soothe muscle aches. Cold therapy (ice packs) can be used to help swelling both after injury and after prolonged use of areas of chronic pain/aches.  Pain medication:  500 mg Naprosyn/Aleve (naproxen) every 12 hours with food:  AVOID other NSAIDs while taking this (may have Tylenol).  May take muscle relaxer as needed for severe pain / spasm.  (This medication may cause you to become tired so it is important you do not drink alcohol or operate heavy machinery while on this medication.  Recommend your first dose to be taken before bedtime to monitor for side effects safely)  Important to follow up with specialist(s) below for further evaluation/management if your symptoms persist or worsen.    ED Prescriptions    Medication Sig Dispense Auth. Provider   cyclobenzaprine (FLEXERIL) 5 MG tablet Take 1 tablet (5 mg total) by mouth 2 (two) times daily as needed for up to 7 days for muscle spasms. 14 tablet Hall-Potvin, [place], PA-C     PDMP not reviewed this encounter.   Hall-Potvin, [place], [place] 01/21/20 [phone]

## 2020-01-21 NOTE — ED Triage Notes (Signed)
Pt here for left shoulder pain and upper back pain; pt sts is under shoulder blade area; pt sts worse with movement and positioning; denies injury

## 2020-04-09 ENCOUNTER — Other Ambulatory Visit: Payer: Self-pay | Admitting: Adult Health

## 2020-04-09 DIAGNOSIS — Z1231 Encounter for screening mammogram for malignant neoplasm of breast: Secondary | ICD-10-CM

## 2020-11-12 ENCOUNTER — Ambulatory Visit
Admission: RE | Admit: 2020-11-12 | Discharge: 2020-11-12 | Disposition: A | Discharge status: Home / Self Care | Payer: Commercial Managed Care - PPO | Source: Ambulatory Visit | Attending: Adult Health | Admitting: Adult Health

## 2020-11-12 ENCOUNTER — Other Ambulatory Visit: Payer: Self-pay

## 2020-11-12 DIAGNOSIS — Z1231 Encounter for screening mammogram for malignant neoplasm of breast: Secondary | ICD-10-CM | POA: Insufficient documentation

## 2022-01-18 ENCOUNTER — Other Ambulatory Visit: Payer: Self-pay | Admitting: Adult Health

## 2022-01-18 DIAGNOSIS — Z1231 Encounter for screening mammogram for malignant neoplasm of breast: Secondary | ICD-10-CM

## 2022-03-08 ENCOUNTER — Ambulatory Visit
Admission: RE | Admit: 2022-03-08 | Discharge: 2022-03-08 | Disposition: A | Discharge status: Home / Self Care | Payer: 59 | Source: Ambulatory Visit | Attending: Adult Health | Admitting: Adult Health

## 2022-03-08 DIAGNOSIS — Z1231 Encounter for screening mammogram for malignant neoplasm of breast: Secondary | ICD-10-CM

## 2022-12-11 IMAGING — MG MM DIGITAL SCREENING BILAT W/ TOMO AND CAD
6 of 12 series · 6 of 36 positions shown · non-contrast
Comparison: Previous exam(s).

ACR Breast Density Category a: The breast tissue is almost entirely
fatty.

CLINICAL DATA: Screening.

EXAM:
DIGITAL SCREENING BILATERAL MAMMOGRAM WITH TOMOSYNTHESIS AND CAD
TECHNIQUE: Bilateral screening digital craniocaudal and mediolateral oblique
mammograms were obtained. Bilateral screening digital breast
tomosynthesis was performed. The images were evaluated with
computer-aided detection.

[R MLO synth-2D (1 of 2)]
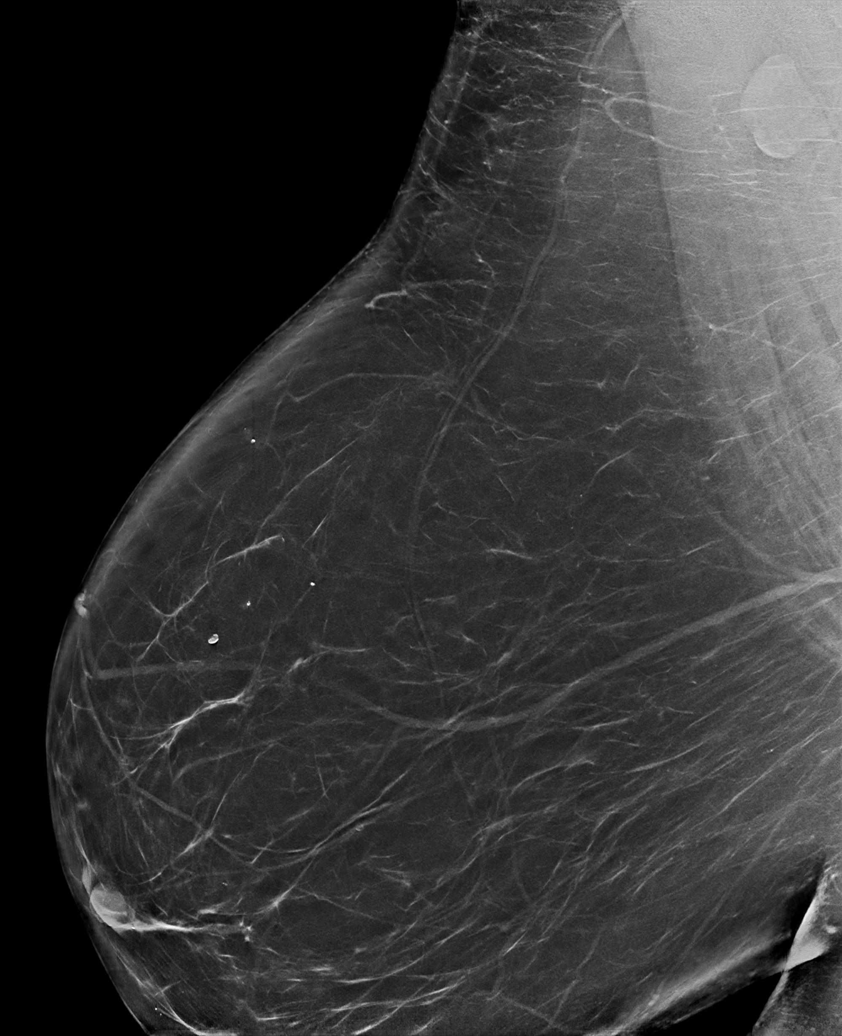

[L CC synth-2D]
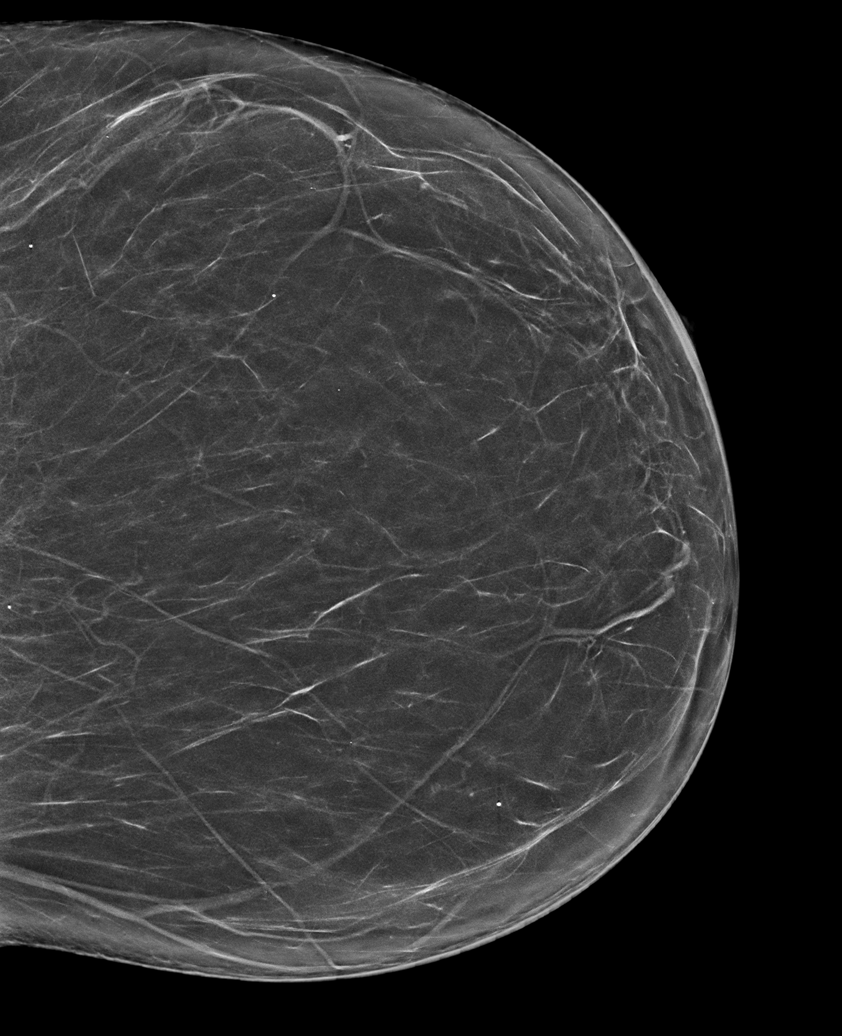

[L MLO synth-2D (1 of 2)]
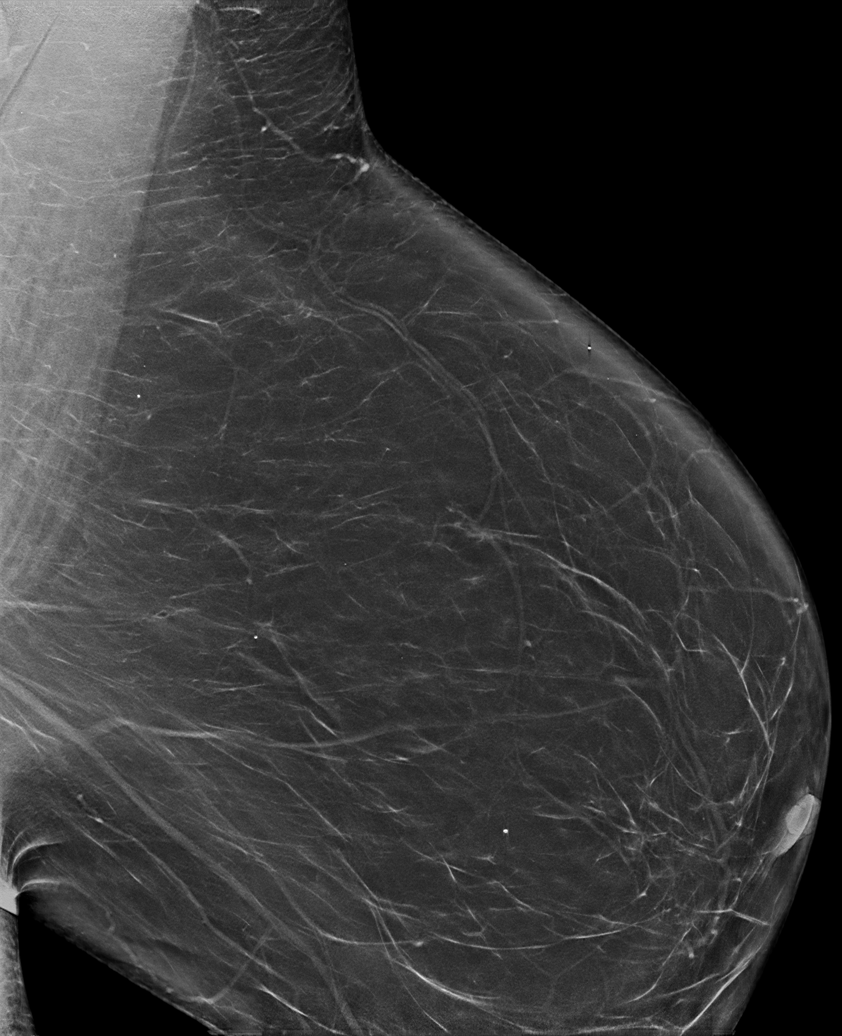

[R MLO synth-2D (2 of 2)]
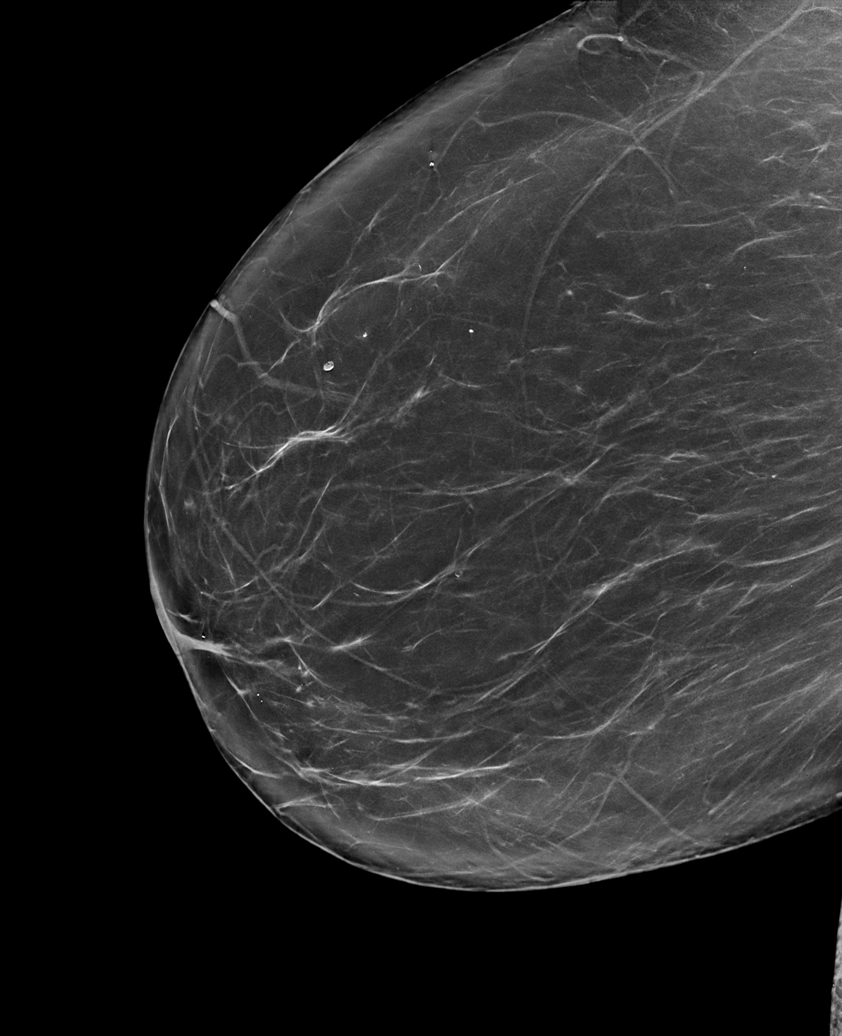

[R CC synth-2D]
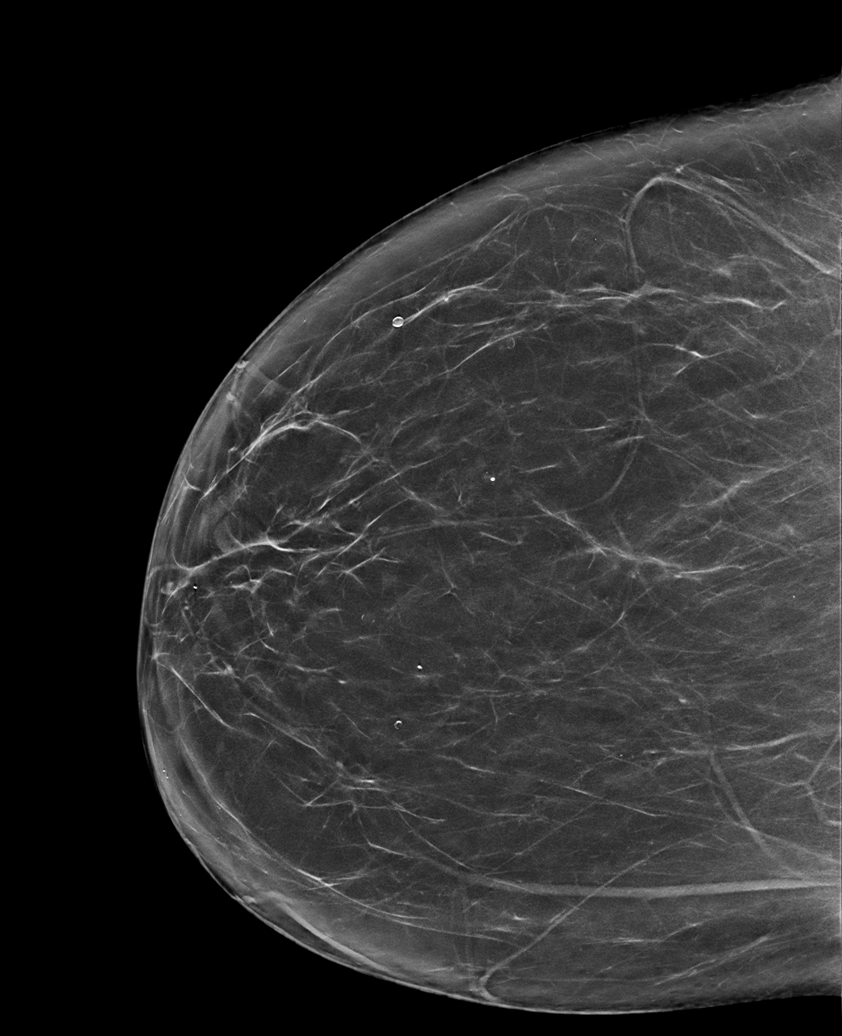

[L MLO synth-2D (2 of 2)]
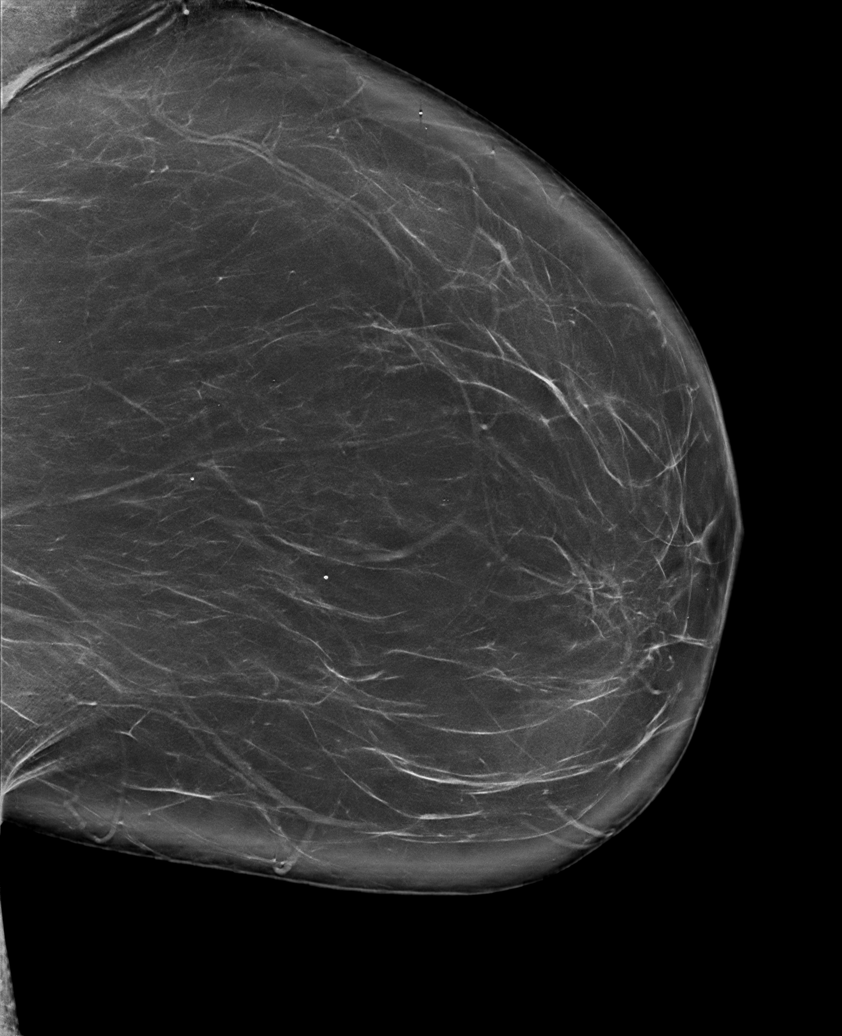

[6 of 36 positions shown; findings below may reference images not displayed]

FINDINGS: There are no findings suspicious for malignancy.
IMPRESSION: No mammographic evidence of malignancy. A result letter of this
screening mammogram will be mailed directly to the patient.

RECOMMENDATION:
Screening mammogram in one year. (Code:0E-3-N98)

BI-RADS CATEGORY  1: Negative.

## 2023-04-19 ENCOUNTER — Other Ambulatory Visit: Payer: Self-pay | Admitting: Adult Health

## 2023-04-19 DIAGNOSIS — Z1231 Encounter for screening mammogram for malignant neoplasm of breast: Secondary | ICD-10-CM

## 2023-05-08 ENCOUNTER — Ambulatory Visit
Admission: RE | Admit: 2023-05-08 | Discharge: 2023-05-08 | Disposition: A | Discharge status: Home / Self Care | Payer: 59 | Source: Ambulatory Visit | Attending: Adult Health | Admitting: Adult Health

## 2023-05-08 DIAGNOSIS — Z1231 Encounter for screening mammogram for malignant neoplasm of breast: Secondary | ICD-10-CM

## 2023-05-15 ENCOUNTER — Other Ambulatory Visit: Payer: Self-pay | Admitting: Adult Health

## 2023-05-15 DIAGNOSIS — R928 Other abnormal and inconclusive findings on diagnostic imaging of breast: Secondary | ICD-10-CM

## 2023-05-25 ENCOUNTER — Ambulatory Visit
Admission: RE | Admit: 2023-05-25 | Discharge: 2023-05-25 | Disposition: A | Discharge status: Home / Self Care | Payer: 59 | Source: Ambulatory Visit | Attending: Adult Health | Admitting: Adult Health

## 2023-05-25 DIAGNOSIS — R928 Other abnormal and inconclusive findings on diagnostic imaging of breast: Secondary | ICD-10-CM
